# Patient Record
Sex: Male | Born: 1935 | Race: White | Hispanic: No | Marital: Married | Smoking: Former smoker
Health system: Southern US, Community
[De-identification: ages and names within clinical notes are randomized; demographics above are authoritative.]

## PROBLEM LIST (undated history)

## (undated) DIAGNOSIS — I10 Essential (primary) hypertension: Secondary | ICD-10-CM

## (undated) DIAGNOSIS — Z953 Presence of xenogenic heart valve: Secondary | ICD-10-CM

## (undated) DIAGNOSIS — L97909 Non-pressure chronic ulcer of unspecified part of unspecified lower leg with unspecified severity: Secondary | ICD-10-CM

## (undated) DIAGNOSIS — I739 Peripheral vascular disease, unspecified: Secondary | ICD-10-CM

## (undated) DIAGNOSIS — IMO0002 Reserved for concepts with insufficient information to code with codable children: Secondary | ICD-10-CM

## (undated) HISTORY — DX: Non-pressure chronic ulcer of unspecified part of unspecified lower leg with unspecified severity: L97.909

## (undated) HISTORY — PX: AORTIC VALVE REPLACEMENT: SHX41

## (undated) HISTORY — PX: APPENDECTOMY: SHX54

## (undated) HISTORY — PX: VERTEBRAL AUGMENTATION: SHX2660

## (undated) HISTORY — PX: CARDIAC CATHETERIZATION: SHX172

## (undated) HISTORY — DX: Presence of xenogenic heart valve: Z95.3

## (undated) HISTORY — DX: Peripheral vascular disease, unspecified: I73.9

## (undated) HISTORY — PX: CATARACT EXTRACTION: SUR2

## (undated) HISTORY — PX: HERNIA REPAIR: SHX51

## (undated) HISTORY — PX: PARTIAL COLECTOMY: SHX5273

## (undated) HISTORY — DX: Essential (primary) hypertension: I10

## (undated) HISTORY — DX: Reserved for concepts with insufficient information to code with codable children: IMO0002

---

## 2007-11-30 ENCOUNTER — Encounter: Payer: Self-pay | Admitting: Cardiology

## 2008-01-18 ENCOUNTER — Encounter: Payer: Self-pay | Admitting: Cardiology

## 2008-04-07 ENCOUNTER — Ambulatory Visit: Payer: Self-pay | Admitting: Cardiology

## 2008-04-14 ENCOUNTER — Ambulatory Visit: Payer: Self-pay | Admitting: Cardiology

## 2008-06-13 ENCOUNTER — Ambulatory Visit: Payer: Self-pay | Admitting: Cardiovascular Disease

## 2008-06-27 ENCOUNTER — Ambulatory Visit: Payer: Self-pay | Admitting: Cardiology

## 2008-06-29 ENCOUNTER — Encounter: Payer: Self-pay | Admitting: Cardiology

## 2008-11-22 ENCOUNTER — Encounter: Payer: Self-pay | Admitting: Cardiology

## 2009-02-24 ENCOUNTER — Encounter: Payer: Self-pay | Admitting: Cardiology

## 2009-06-28 ENCOUNTER — Ambulatory Visit: Payer: Self-pay | Admitting: Cardiology

## 2009-07-07 ENCOUNTER — Ambulatory Visit: Payer: Self-pay | Admitting: Cardiology

## 2009-08-25 DIAGNOSIS — I10 Essential (primary) hypertension: Secondary | ICD-10-CM | POA: Insufficient documentation

## 2009-08-25 DIAGNOSIS — I739 Peripheral vascular disease, unspecified: Secondary | ICD-10-CM

## 2009-08-28 ENCOUNTER — Encounter: Payer: Self-pay | Admitting: Cardiology

## 2009-09-15 ENCOUNTER — Encounter: Payer: Self-pay | Admitting: Cardiology

## 2009-11-23 ENCOUNTER — Encounter: Payer: Self-pay | Admitting: Cardiology

## 2009-12-05 ENCOUNTER — Encounter: Admission: RE | Admit: 2009-12-05 | Discharge: 2009-12-05 | Payer: Self-pay | Admitting: Neurosurgery

## 2009-12-29 ENCOUNTER — Encounter: Admission: RE | Admit: 2009-12-29 | Discharge: 2009-12-29 | Payer: Self-pay | Admitting: Neurosurgery

## 2010-01-08 ENCOUNTER — Ambulatory Visit: Payer: Self-pay | Admitting: Cardiology

## 2010-01-08 DIAGNOSIS — Z954 Presence of other heart-valve replacement: Secondary | ICD-10-CM | POA: Insufficient documentation

## 2010-01-08 DIAGNOSIS — Z9889 Other specified postprocedural states: Secondary | ICD-10-CM

## 2010-01-08 DIAGNOSIS — I209 Angina pectoris, unspecified: Secondary | ICD-10-CM

## 2010-01-19 ENCOUNTER — Encounter: Payer: Self-pay | Admitting: Cardiology

## 2010-03-07 ENCOUNTER — Encounter: Admission: RE | Admit: 2010-03-07 | Discharge: 2010-03-07 | Payer: Self-pay | Admitting: Neurosurgery

## 2010-06-05 ENCOUNTER — Encounter: Payer: Self-pay | Admitting: Cardiology

## 2010-06-06 ENCOUNTER — Encounter: Payer: Self-pay | Admitting: Physician Assistant

## 2010-06-06 ENCOUNTER — Encounter: Payer: Self-pay | Admitting: Cardiology

## 2010-06-07 ENCOUNTER — Ambulatory Visit: Payer: Self-pay | Admitting: Cardiology

## 2010-06-07 ENCOUNTER — Encounter: Payer: Self-pay | Admitting: Cardiology

## 2010-06-10 ENCOUNTER — Encounter: Payer: Self-pay | Admitting: Physician Assistant

## 2010-07-04 ENCOUNTER — Encounter: Admission: RE | Admit: 2010-07-04 | Discharge: 2010-07-04 | Payer: Self-pay | Admitting: Neurosurgery

## 2010-08-24 ENCOUNTER — Ambulatory Visit: Payer: Self-pay | Admitting: Cardiology

## 2010-09-06 ENCOUNTER — Encounter: Admission: RE | Admit: 2010-09-06 | Discharge: 2010-09-06 | Payer: Self-pay | Admitting: Neurosurgery

## 2010-09-25 ENCOUNTER — Encounter: Payer: Self-pay | Admitting: Cardiology

## 2010-09-29 ENCOUNTER — Encounter: Payer: Self-pay | Admitting: Cardiology

## 2010-10-30 ENCOUNTER — Encounter: Admission: RE | Admit: 2010-10-30 | Discharge: 2010-10-30 | Payer: Self-pay | Admitting: Neurosurgery

## 2010-12-08 ENCOUNTER — Encounter: Payer: Self-pay | Admitting: Cardiology

## 2011-01-03 NOTE — Consult Note (Signed)
Summary: CARDIOLOGY CONSULT/ [facility]  CARDIOLOGY CONSULT/ [facility]   Imported By: Zachary George 08/24/2010 10:30:21  _____________________________________________________________________  External Attachment:    Type:   Image     Comment:   External Document

## 2011-01-03 NOTE — Assessment & Plan Note (Signed)
Summary: 6 MO FU PER JAN REMINDER-SRS   Visit Type:  Follow-up Primary Provider:  Sherril Croon  CC:  follow-up visit.  History of Present Illness: the patient is an 77 year old male with a history of severe peripheral arterial disease as well as a nonhealing ulcer. The patient is status post aortic valve replacement and single-vessel coronary bypass grafting. The patient has resolution of his left leg ulcer. He is less lower extremity edema and on his last office visit. He has severe peripheral vascular disease. He has been evaluated by Dr. Excell Seltzer who felt that he had more venous stasis and arterial inflow disease. Recent lower extremity duplex Doppler done by [facility] revealed absence of flow in the bilateral anterior tibial and posterior tibial arteries. ABIs could not be performed.  The patient is essentially wheelchair bound. He does walk some in the house but it shot up quickly short of breath on exertion and also reports chest tightness. His symptoms are consistent with angina. He has not used any sublingual nitroglycerin. He also has no long-acting nitroglycerin as part of his medical regimen. The patient is hypertensive in the office today but according to his daughter his blood pressures controlled at home. The patient denies any orthopnea PND palpitations or syncope. He reports no resting claudication.  Preventive Screening-Counseling & Management  Alcohol-Tobacco     Smoking Status: quit     Year Quit: 1960  Current Medications (verified): 1)  Metoprolol Tartrate 50 Mg Tabs (Metoprolol Tartrate) .... Take 1 Tablet By Mouth Once A Day 2)  Zinc/copper 2mg  .... Take 1 Tablet By Mouth Once A Day 3)  Hydroxyzine Hcl 25 Mg Tabs (Hydroxyzine Hcl) .... Take 1/2 Tablet By Mouth Three Times A Day As Needed 4)  Citalopram Hydrobromide 20 Mg Tabs (Citalopram Hydrobromide) .... Take 1 Tablet By Mouth Once A Day 5)  Amitriptyline Hcl 10 Mg Tabs (Amitriptyline Hcl) .... Take 1 Tablet By  Mouth Once A Day 6)  Calcium-Magnesium-Zinc 333-133-8.3 Mg Tabs (Calcium-Magnesium-Zinc) .... Take 1 Tablet By Mouth Once A Day 7)  Folic Acid 1 Mg Tabs (Folic Acid) .... Take 1 Tablet By Mouth Once A Day 8)  Flax Seed Oil 1000 Mg Caps (Flaxseed (Linseed)) .... Take 1 Tablet By Mouth Once A Day 9)  Ascorbic Acid 500 Mg Tabs (Ascorbic Acid) .... Take 1 Tablet By Mouth Twice A Day 10)  Coenzyme Q10 25 Mg Tabs (Coenzyme Q10) .... Take 1 Tablet By Mouth Once A Day 11)  Vitamin E 400 Unit Caps (Vitamin E) .... Take 1 Tablet By Mouth Once A Day 12)  Diphenhydramine Hcl 25 Mg Tabs (Diphenhydramine Hcl) .... Take 1 Tablet By Mouth Once A Day 13)  Zyrtec Allergy 10 Mg Tabs (Cetirizine Hcl) .... Take 1 Tablet By Mouth Once A Day 14)  Hydrocodone-Acetaminophen 7.5-500 Mg Tabs (Hydrocodone-Acetaminophen) .... As Needed 15)  Maxalt-Mlt 5 Mg Tbdp (Rizatriptan Benzoate) .... As Needed 16)  Furosemide 20 Mg Tabs (Furosemide) .... Take 1 Tablet By Mouth Once A Day As Needed 17)  Finasteride 5 Mg Tabs (Finasteride) .... Take 1 Tablet By Mouth Once A Day 18)  Aricept 5 Mg Tabs (Donepezil Hcl) .... Take 1 Tablet By Mouth Once A Day 19)  Excedrin Migraine 250-250-65 Mg Tabs (Aspirin-Acetaminophen-Caffeine) .... As Needed 20)  Tylenol Pm Extra Strength 500-25 Mg Tabs (Diphenhydramine-Apap (Sleep)) .... As Needed 21)  Systane 0.4-0.3 % Soln (Polyethyl Glycol-Propyl Glycol) .... As Needed 22)  B Complex 100  Tabs (B Complex Vitamins) .... Take 1  Tablet By Mouth Once A Day 23)  Fish Oil 1200 Mg Caps (Omega-3 Fatty Acids) .... Take 1 Tablet By Mouth Once A Day 24)  Sam-E Complete 400 Mg Tbec (S-Adenosylmethionine) .... Take 1 Tablet By Mouth Once A Day 25)  Coq-10 100 Mg Caps (Coenzyme Q10) .... Take 1 Tablet By Mouth Once A Day 26)  B-Caro-T 15 Mg Caps (Beta Carotene) .... Take 1 Tablet By Mouth Once A Day  Allergies (verified): 1)  ! * Ivp Dye  Comments:  Nurse/Medical Assistant: The patient's medications and  allergies were reviewed with the patient and were updated in the Medication and Allergy Lists. List reviewed.  Past History:  Past Medical History: HYPERTENSION, UNSPECIFIED (ICD-401.9) PVD (ICD-443.9) Degenerative disk disease History of right lower exremity ulcer, resolved Status post aortic valve replacement. Bioprosthesis. Mild outflow gradient LVOT these 3.5 m/s  Social History: Smoking Status:  quit  Review of Systems       The patient complains of fatigue, decreased hearing, chest pain, shortness of breath, and leg swelling.  The patient denies malaise, fever, weight gain/loss, vision loss, hoarseness, palpitations, prolonged cough, wheezing, sleep apnea, coughing up blood, abdominal pain, blood in stool, nausea, vomiting, diarrhea, heartburn, incontinence, blood in urine, muscle weakness, joint pain, rash, skin lesions, headache, fainting, dizziness, depression, anxiety, enlarged lymph nodes, easy bruising or bleeding, and environmental allergies.    Vital Signs:  Patient profile:   77 year old male Height:      68 inches Weight:      208 pounds Pulse rate:   58 / minute BP sitting:   161 / 71  (left arm) Cuff size:   large  Vitals Entered By: Carlye Grippe (January 08, 2010 10:42 AM) CC: follow-up visit   Physical Exam  Additional Exam:  General: Well-developed, well-nourished in no distress head: Normocephalic and atraumatic eyes PERRLA/EOMI intact, conjunctiva and lids normal nose: No deformity or lesions mouth normal dentition, normal posterior pharynx neck: Supple, no JVD.  No masses, thyromegaly or abnormal cervical nodes. bilateral carotid bruits. lungs: Normal breath sounds bilaterally without wheezing.  Normal percussion heart: regular rate and rhythm with normal S1 and S2, no S3 or S4.  PMI is normal.  2/6 crescendo decrescendo murmur. abdomen: Normal bowel sounds, abdomen is soft and nontender without masses, organomegaly or hernias noted.  No  hepatosplenomegaly musculoskeletal: Back normal, normal gait muscle strength and tone normal pulsus: absent pulses in dorsalis pedis and posterior tibial pulses. Bilaterally. Extremities:workup was performed pitting edema. Varicose veins no definite ulcers. neurologic: Alert and oriented x 3 skin: Intact without lesions or rashes cervical nodes: No significant adenopathy psychologic: Normal affect    Impression & Recommendations:  Problem # 1:  CAROTID ENDARTERECTOMY, HX OF (ICD-V15.1) the patient is status post left carotid endarterectomy. He has bilateral carotid bruits. He is scheduled for carotid Dopplers.  Problem # 2:  AORTIC VALVE REPLACEMENT, HX OF (ICD-V43.3) apparently normal function of the aortic bioprosthesis. Recent echocardiogram shows normal valve function.  Problem # 3:  HYPERTROPHIC CARDIOMYOPATHY (ICD-425.1) the patient has an outflow gradient with a velocity of 3.5 m/s. I suspect is contributing to his shortness of breath on exertion. However we will continue with medical therapy and the patient is adequately beta blocked with good control of heart rate. His updated medication list for this problem includes:    Metoprolol Tartrate 50 Mg Tabs (Metoprolol tartrate) .Marland Kitchen... Take 1 tablet by mouth once a day    Furosemide 20 Mg Tabs (Furosemide) .Marland KitchenMarland KitchenMarland KitchenMarland Kitchen  Take 1 tablet by mouth once a day as needed    Isosorbide Mononitrate Cr 30 Mg Xr24h-tab (Isosorbide mononitrate) .Marland Kitchen... Take one tablet by mouth daily    Nitrostat 0.4 Mg Subl (Nitroglycerin) .Marland Kitchen... 1 tablet under tongue at onset of chest pain; you may repeat every 5 minutes for up to 3 doses.  Problem # 4:  HYPERTENSION, UNSPECIFIED (ICD-401.9) hypertension is poorly controlled in the office but reportedly at home the patient's normal blood pressure readings. I made no changes in his antihypertensive regimen and he can follow up with his primary care physician. His updated medication list for this problem includes:     Metoprolol Tartrate 50 Mg Tabs (Metoprolol tartrate) .Marland Kitchen... Take 1 tablet by mouth once a day    Furosemide 20 Mg Tabs (Furosemide) .Marland Kitchen... Take 1 tablet by mouth once a day as needed  Problem # 5:  ANGINA, STABLE (ICD-413.9) the patient does have stable exertional angina. I added isosorbide mononitrate was medical regimen as well provided him with p.r.n. nitroglycerin. His updated medication list for this problem includes:    Metoprolol Tartrate 50 Mg Tabs (Metoprolol tartrate) .Marland Kitchen... Take 1 tablet by mouth once a day    Isosorbide Mononitrate Cr 30 Mg Xr24h-tab (Isosorbide mononitrate) .Marland Kitchen... Take one tablet by mouth daily    Nitrostat 0.4 Mg Subl (Nitroglycerin) .Marland Kitchen... 1 tablet under tongue at onset of chest pain; you may repeat every 5 minutes for up to 3 doses.  Other Orders: Carotid Duplex (Carotid Duplex)  Patient Instructions: 1)  Your physician has requested that you have a carotid duplex. This test is an ultrasound of the carotid arteries in your neck. It looks at blood flow through these arteries that supply the brain with blood. Allow one hour for this exam. There are no restrictions or special instructions. 2)  Start Imdur (isosorbide) 30mg  by mouth once daily. 3)  Your physician recommended you take 1 tablet (or 1 spray) under tongue at onset of chest pain; you may repeat every 5 minutes for up to 3 doses. If 3 or more doses are required, call 911 and proceed to the ER immediately. 4)  Your physician wants you to follow-up in: 6 months. You will receive a reminder letter in the mail one-two months in advance. If you don't receive a letter, please call our office to schedule the follow-up appointment. Prescriptions: NITROSTAT 0.4 MG SUBL (NITROGLYCERIN) 1 tablet under tongue at onset of chest pain; you may repeat every 5 minutes for up to 3 doses.  #25 x 3   Entered by:   Cyril Loosen, RN, BSN   Authorized by:   Lewayne Bunting, MD, [facility]   Signed by:   Cyril Loosen, RN, BSN on  01/08/2010   Method used:   Electronically to        [facility] (retail)       [address]       [place], [place]  [address]       Ph: [phone]       Fax: [phone]   RxID:   [id number]   Handout requested. ISOSORBIDE MONONITRATE CR 30 MG XR24H-TAB (ISOSORBIDE MONONITRATE) Take one tablet by mouth daily  #30 x 6   Entered by:   Cyril Loosen, RN, BSN   Authorized by:   Lewayne Bunting, MD, [facility]   Signed by:   Cyril Loosen, RN, BSN on 01/08/2010   Method used:   Electronically to        [facility] (  retail)       [address]       [place], [place]  [address]       Ph: [phone]       Fax: [phone]   RxID:   [id number]   Handout requested.

## 2011-01-03 NOTE — Assessment & Plan Note (Signed)
Summary: 6 MO FU PER AUG REMINDER-SRS   Visit Type:  Follow-up Primary Provider:  Sherril Croon   History of Present Illness: the patient is an 77 year old male with a history of severe peripheral vascular disease and a prior nonhealing ulcer. He is status post aortic valve replacement and single-vessel coronary bypass grafting. The patient has both venous stasis and arterial inflow disease. Currently however he has no ulcers.a recent echocardiogram the patient was also found to have hypertrophic obstructive cardiomyopathy with outflow velocity of 5.4 m/s. The patient was placed on beta blocker which was further increased. He also has history of stroke. He status post coronary bypass grafting in 1999. He had an aortic valve replacement in 1996.  He states he gets fatigued. He denies any chest pressure and some breath at rest. He does have shortness of breath on minimal exertion. He denies any presyncope or syncope.    Preventive Screening-Counseling & Management  Alcohol-Tobacco     Smoking Status: quit     Year Quit: 1960  Current Medications (verified): 1)  Metoprolol Tartrate 50 Mg Tabs (Metoprolol Tartrate) .... Take 1 Tablet By Mouth Two Times A Day 2)  Zinc/copper 2mg  .... Take 1 Tablet By Mouth Once A Day 3)  Citalopram Hydrobromide 20 Mg Tabs (Citalopram Hydrobromide) .... Take 1 Tablet By Mouth Once A Day 4)  Calcium-Magnesium-Zinc 333-133-8.3 Mg Tabs (Calcium-Magnesium-Zinc) .... Take 1 Tablet By Mouth Once A Day 5)  Folic Acid 1 Mg Tabs (Folic Acid) .... Take 1 Tablet By Mouth Once A Day 6)  Flax Seed Oil 1000 Mg Caps (Flaxseed (Linseed)) .... Take 1 Tablet By Mouth Once A Day 7)  Ascorbic Acid 500 Mg Tabs (Ascorbic Acid) .... Take 1 Tablet By Mouth Twice A Day 8)  Vitamin E 400 Unit Caps (Vitamin E) .... Take 1 Tablet By Mouth Once A Day 9)  Diphenhydramine Hcl 25 Mg Tabs (Diphenhydramine Hcl) .... Take 1 Tablet By Mouth Once A Day 10)  Zyrtec Allergy 10 Mg Tabs (Cetirizine Hcl) ....  Take 1 Tablet By Mouth Once A Day 11)  Hydrocodone-Acetaminophen 7.5-500 Mg Tabs (Hydrocodone-Acetaminophen) .... As Needed 12)  Maxalt-Mlt 5 Mg Tbdp (Rizatriptan Benzoate) .... As Needed 13)  Furosemide 20 Mg Tabs (Furosemide) .... Take 1 Tablet By Mouth Once A Day 14)  Finasteride 5 Mg Tabs (Finasteride) .... Take 1 Tablet By Mouth Once A Day 15)  Aricept 5 Mg Tabs (Donepezil Hcl) .... Take 1 Tablet By Mouth Once A Day 16)  Excedrin Migraine 250-250-65 Mg Tabs (Aspirin-Acetaminophen-Caffeine) .... As Needed 17)  Tylenol Pm Extra Strength 500-25 Mg Tabs (Diphenhydramine-Apap (Sleep)) .... As Needed 18)  Systane 0.4-0.3 % Soln (Polyethyl Glycol-Propyl Glycol) .... As Needed 19)  B Complex 100  Tabs (B Complex Vitamins) .... Take 1 Tablet By Mouth Once A Day 20)  Fish Oil 1200 Mg Caps (Omega-3 Fatty Acids) .... Take 1 Tablet By Mouth Once A Day 21)  Sam-E Complete 400 Mg Tbec (S-Adenosylmethionine) .... Take 1 Tablet By Mouth Two Times A Day 22)  Coq-10 100 Mg Caps (Coenzyme Q10) .... Take 1 Tablet By Mouth Once A Day 23)  B-Caro-T 15 Mg Caps (Beta Carotene) .... Take 1 Tablet By Mouth Once A Day 24)  Nitrostat 0.4 Mg Subl (Nitroglycerin) .Marland Kitchen.. 1 Tablet Under Tongue At Onset of Chest Pain; You May Repeat Every 5 Minutes For Up To 3 Doses. 25)  Meclizine Hcl 12.5 Mg Tabs (Meclizine Hcl) .... Take 2 Tablet By Mouth Once A Day 26)  Klor-Con 10 10 Meq Cr-Tabs (Potassium Chloride) .... Take 1 Tablet By Mouth Once A Day 27)  Diphenhydramine Hcl 25 Mg Tabs (Diphenhydramine Hcl) .... Take 2 Tablet By Mouth Once A Day  Allergies (verified): 1)  ! * Ivp Dye  Comments:  Nurse/Medical Assistant: The patient's medication list and allergies were reviewed with the patient and were updated in the Medication and Allergy Lists.  Past History:  Past Medical History: Last updated: 01/08/2010 HYPERTENSION, UNSPECIFIED (ICD-401.9) PVD (ICD-443.9) Degenerative disk disease History of right lower exremity  ulcer, resolved Status post aortic valve replacement. Bioprosthesis. Mild outflow gradient LVOT these 3.5 m/s  Past Surgical History: Last updated: 08/25/2009 Hernia surgery Partial colectomy Vertebral augmentation Appendectomy Carotid Endarterectomy Cataract Extraction Valve Replacement-Aortic (S/P)  Family History: Last updated: 08/25/2009 Family History of Cancer:  Family History of Coronary Artery Disease:  Family History of Diabetes:   Social History: Last updated: 08/25/2009 Retired  Married  Tobacco Use - No.  Alcohol Use - no Regular Exercise - no Drug Use - no  Risk Factors: Smoking Status: quit (08/24/2010)  Review of Systems       The patient complains of fatigue, decreased hearing, and shortness of breath.  The patient denies malaise, fever, weight gain/loss, vision loss, hoarseness, chest pain, palpitations, prolonged cough, wheezing, sleep apnea, coughing up blood, abdominal pain, blood in stool, nausea, vomiting, diarrhea, heartburn, incontinence, blood in urine, muscle weakness, joint pain, leg swelling, rash, skin lesions, headache, fainting, dizziness, depression, anxiety, enlarged lymph nodes, easy bruising or bleeding, and environmental allergies.    Vital Signs:  Patient profile:   77 year old male Height:      68 inches Weight:      217 pounds BMI:     33.11 O2 Sat:      94 % on Room air Pulse rate:   84 / minute BP sitting:   167 / 78  (left arm) Cuff size:   large  Vitals Entered By: Carlye Grippe (August 24, 2010 1:56 PM)  O2 Flow:  Room air  Physical Exam  Additional Exam:  General: Well-developed, well-nourished in no distress head: Normocephalic and atraumatic eyes PERRLA/EOMI intact, conjunctiva and lids normal nose: No deformity or lesions mouth normal dentition, normal posterior pharynx neck: Supple, no JVD.  No masses, thyromegaly or abnormal cervical nodes. bilateral carotid bruits. lungs: Normal breath sounds bilaterally  without wheezing.  Normal percussion heart: regular rate and rhythm with normal S1 and S2, no S3 or S4.  PMI is normal.  2/6 crescendo decrescendo murmur. abdomen: Normal bowel sounds, abdomen is soft and nontender without masses, organomegaly or hernias noted.  No hepatosplenomegaly musculoskeletal: Back normal, normal gait muscle strength and tone normal pulsus: absent pulses in dorsalis pedis and posterior tibial pulses. Bilaterally. Extremities:workup was performed pitting edema. Varicose veins no definite ulcers. neurologic: Alert and oriented x 3 skin: Intact without lesions or rashes cervical nodes: No significant adenopathy psychologic: Normal affect    Impression & Recommendations:  Problem # 1:  HYPERTROPHIC CARDIOMYOPATHY (ICD-425.1) significant outflow gradient with systolic anterior motion of the mitral valve. Metoprolol be further increased to 50 mg twice a day. The patient is not a surgical candidate however. The following medications were removed from the medication list:    Isosorbide Mononitrate Cr 30 Mg Xr24h-tab (Isosorbide mononitrate) .Marland Kitchen... Take one tablet by mouth daily His updated medication list for this problem includes:    Metoprolol Tartrate 50 Mg Tabs (Metoprolol tartrate) .Marland Kitchen... Take 1 tablet by mouth two  times a day    Furosemide 20 Mg Tabs (Furosemide) .Marland Kitchen... Take 1 tablet by mouth once a day    Nitrostat 0.4 Mg Subl (Nitroglycerin) .Marland Kitchen... 1 tablet under tongue at onset of chest pain; you may repeat every 5 minutes for up to 3 doses.  Problem # 2:  AORTIC VALVE REPLACEMENT, HX OF (ICD-V43.3) normal heart valve function by recent echocardiogram  Problem # 3:  HYPERTENSION, UNSPECIFIED (ICD-401.9) blood pressure was poorly controlled and will further increase his Toprol His updated medication list for this problem includes:    Metoprolol Tartrate 50 Mg Tabs (Metoprolol tartrate) .Marland Kitchen... Take 1 tablet by mouth two times a day    Furosemide 20 Mg Tabs  (Furosemide) .Marland Kitchen... Take 1 tablet by mouth once a day  Problem # 4:  PVD (ICD-443.9) Assessment: Comment Only  Patient Instructions: 1)  Increase Metoprolol to 50mg  two times a day 2)  Follow up in  6 months Prescriptions: METOPROLOL TARTRATE 50 MG TABS (METOPROLOL TARTRATE) Take 1 tablet by mouth two times a day  #60 x 6   Entered by:   Hoover Brunette, LPN   Authorized by:   Lewayne Bunting, MD, [facility]   Signed by:   Hoover Brunette, LPN on 11/91/4782   Method used:   Electronically to        [facility] (retail)       [address]       [place], [place]  [address]       Ph: [phone]       Fax: [phone]   RxID:   [id number]

## 2011-01-03 NOTE — Medication Information (Signed)
Summary: RX Folder/ MEDICATION LIST  RX Folder/ MEDICATION LIST   Imported By: Dorise Hiss 01/12/2010 10:48:02  _____________________________________________________________________  External Attachment:    Type:   Image     Comment:   External Document

## 2011-01-03 NOTE — Letter (Signed)
Summary: [facility] D/C DR. DHRUV VYAS  [facility]C DR. DHRUV VYAS   Imported By: Zachary George 08/24/2010 10:30:56  _____________________________________________________________________  External Attachment:    Type:   Image     Comment:   External Document

## 2011-01-03 NOTE — Progress Notes (Signed)
Summary: Office Visit/ SURGERY/HOSPITALIZATION LIST  Office Visit/ SURGERY/HOSPITALIZATION LIST   Imported By: Dorise Hiss 01/12/2010 10:51:34  _____________________________________________________________________  External Attachment:    Type:   Image     Comment:   External Document

## 2011-01-03 NOTE — Consult Note (Signed)
Summary: PULMONARY CONSULT/ DR. HENDERSON  PULMONARY CONSULT/ DR. HENDERSON   Imported By: Zachary George 08/24/2010 10:31:20  _____________________________________________________________________  External Attachment:    Type:   Image     Comment:   External Document

## 2011-01-21 ENCOUNTER — Other Ambulatory Visit: Payer: Self-pay | Admitting: Neurosurgery

## 2011-01-21 DIAGNOSIS — M47816 Spondylosis without myelopathy or radiculopathy, lumbar region: Secondary | ICD-10-CM

## 2011-01-23 ENCOUNTER — Ambulatory Visit
Admission: RE | Admit: 2011-01-23 | Discharge: 2011-01-23 | Disposition: A | Discharge status: Home / Self Care | Payer: Self-pay | Source: Ambulatory Visit | Attending: Neurosurgery | Admitting: Neurosurgery

## 2011-01-23 DIAGNOSIS — M47816 Spondylosis without myelopathy or radiculopathy, lumbar region: Secondary | ICD-10-CM

## 2011-01-24 ENCOUNTER — Encounter: Payer: Self-pay | Admitting: Cardiology

## 2011-01-24 ENCOUNTER — Ambulatory Visit (INDEPENDENT_AMBULATORY_CARE_PROVIDER_SITE_OTHER): Payer: Medicare Other | Admitting: Cardiology

## 2011-01-24 DIAGNOSIS — J449 Chronic obstructive pulmonary disease, unspecified: Secondary | ICD-10-CM | POA: Insufficient documentation

## 2011-01-24 DIAGNOSIS — R0602 Shortness of breath: Secondary | ICD-10-CM

## 2011-01-24 DIAGNOSIS — J4489 Other specified chronic obstructive pulmonary disease: Secondary | ICD-10-CM | POA: Insufficient documentation

## 2011-01-24 DIAGNOSIS — I421 Obstructive hypertrophic cardiomyopathy: Secondary | ICD-10-CM

## 2011-01-29 NOTE — Letter (Signed)
Summary: [facility] D/C DR. VYAS  [facility] D[facility]C DR. VYAS   Imported By: Zachary George 01/24/2011 10:35:56  _____________________________________________________________________  External Attachment:    Type:   Image     Comment:   External Document

## 2011-01-29 NOTE — Medication Information (Signed)
Summary: [facility] D/C MEDICATION SHEET ORDER  [facility] D/C MEDICATION SHEET ORDER   Imported By: Zachary George 01/24/2011 10:53:07  _____________________________________________________________________  External Attachment:    Type:   Image     Comment:   External Document

## 2011-01-29 NOTE — Assessment & Plan Note (Signed)
Summary: 6 month followup/rm   Visit Type:  Follow-up Primary Provider:  Sherril Croon   History of Present Illness: The patient is an 77 year old male with multiple medical problems.  He was admitted to year ago with a lower GI bleed.  EGD was negative.  His history of diastolic heart failure.  Has a history of hypertension the patient was seen in the emergency room a month ago with cough and congestion.  Sore throat.  He complained of muscle aches.  He was given amoxicillin for bronchitis. The patient had an echocardiogram done January 2011 areas he has severe asymmetric LVH and systolic anterior motion of the mitral valve consistent with hypertrophic obstructive cardio myopathy.  Left ventricle outflow gradient at rest is 5.4 m/sec there L. intraseptal aneurysm.  The estimated ejection fraction was read as 65%.  Bio prosthetic aortic valve was present.  There is mild/moderate regurgitation. The patient was seen in consultation at that time by Dr. Jens Som.  Again this was in January of 2011.  He presented with heart failure.  This was diastolic heart failure.  His Toprol was started increased to 75 mg a day.  Lasix was discontinued at that time. Of note is that the patient also has history of coronary artery disease and is status post bypass grafting 1999 as well status-post by prostatic aortic valve replacement. The patient has chronic COPD and is oxygen dependent.  He uses oxygen only at night the patient stated he feels well today.  He does have shortness of breath on minimal exertion.  He noticed that this has worsened over the last couple months.  He also has increased lower extremity edema and his primary care physician increased Lasix for two days.  Unfortunately after his last hospitalization his metoprolol was never increased to ameliorate his symptoms of hypertrophic cardiomyopathy.   Preventive Screening-Counseling & Management  Alcohol-Tobacco     Smoking Status: quit     Year Quit:  1960  Current Medications (verified): 1)  Metoprolol Tartrate 50 Mg Tabs (Metoprolol Tartrate) .... Take 1 Tablet By Mouth Once A Day 2)  Zinc/copper 2mg  .... Take 1 Tablet By Mouth Once A Day 3)  Citalopram Hydrobromide 20 Mg Tabs (Citalopram Hydrobromide) .... Take 1 Tablet By Mouth Once A Day 4)  Calcium-Magnesium-Zinc 333-133-8.3 Mg Tabs (Calcium-Magnesium-Zinc) .... Take 1 Tablet By Mouth Once A Day 5)  Folic Acid 1 Mg Tabs (Folic Acid) .... Take 1 Tablet By Mouth Once A Day 6)  Flax Seed Oil 1000 Mg Caps (Flaxseed (Linseed)) .... Take 1 Tablet By Mouth Once A Day 7)  Ascorbic Acid 500 Mg Tabs (Ascorbic Acid) .... Take 1 Tablet By Mouth Twice A Day 8)  Vitamin E 400 Unit Caps (Vitamin E) .... Take 1 Tablet By Mouth Once A Day 9)  Diphenhydramine Hcl 25 Mg Tabs (Diphenhydramine Hcl) .... Take 1 Tablet By Mouth Once A Day 10)  Zyrtec Allergy 10 Mg Tabs (Cetirizine Hcl) .... Take 1 Tablet By Mouth Once A Day 11)  Maxalt-Mlt 5 Mg Tbdp (Rizatriptan Benzoate) .... As Needed 12)  Furosemide 40 Mg Tabs (Furosemide) .... Take 1 Tablet By Mouth Once A Day 13)  Finasteride 5 Mg Tabs (Finasteride) .... Take 1 Tablet By Mouth Once A Day 14)  Excedrin Migraine 250-250-65 Mg Tabs (Aspirin-Acetaminophen-Caffeine) .... As Needed 15)  Tylenol Pm Extra Strength 500-25 Mg Tabs (Diphenhydramine-Apap (Sleep)) .... As Needed 16)  Systane 0.4-0.3 % Soln (Polyethyl Glycol-Propyl Glycol) .... As Needed 17)  B Complex 100  Tabs (  B Complex Vitamins) .... Take 1 Tablet By Mouth Once A Day 18)  Fish Oil 1200 Mg Caps (Omega-3 Fatty Acids) .... Take 1 Tablet By Mouth Once A Day 19)  Sam-E Complete 400 Mg Tbec (S-Adenosylmethionine) .... Take 1 Tablet By Mouth Two Times A Day 20)  Coq-10 100 Mg Caps (Coenzyme Q10) .... Take 1 Tablet By Mouth Once A Day 21)  B-Caro-T 15 Mg Caps (Beta Carotene) .... Take 1 Tablet By Mouth Once A Day 22)  Nitrostat 0.4 Mg Subl (Nitroglycerin) .Marland Kitchen.. 1 Tablet Under Tongue At Onset of Chest  Pain; You May Repeat Every 5 Minutes For Up To 3 Doses. 23)  Meclizine Hcl 12.5 Mg Tabs (Meclizine Hcl) .... Take 2 Tablet By Mouth Once A Day 24)  Klor-Con 10 10 Meq Cr-Tabs (Potassium Chloride) .... Take 1 Tablet By Mouth Once A Day 25)  Diphenhydramine Hcl 25 Mg Tabs (Diphenhydramine Hcl) .... Take 2 Tablet By Mouth Once A Day 26)  Nystatin 100000 Unit/ml Susp (Nystatin) .Marland Kitchen.. 1 Tsp Three Times A Day For Thrush 27)  Vitamin D3 400 Unit Tabs (Cholecalciferol) .... Take 1 Tablet By Mouth Once A Day 28)  Meclizine Hcl 12.5 Mg Tabs (Meclizine Hcl) .... Take 1 Tablet By Mouth Two Times A Day As Needed 29)  Pantoprazole Sodium 40 Mg Tbec (Pantoprazole Sodium) .... Take 1 Tablet By Mouth Once A Day 30)  Stool Softener 100 Mg Caps (Docusate Sodium) .... Take 1 Tablet By Mouth Once A Day 31)  Klor-Con 10 10 Meq Cr-Tabs (Potassium Chloride) .... Take 1 Tablet By Mouth Once A Day 32)  Tramadol Hcl 50 Mg Tabs (Tramadol Hcl) .... Take 1 Tablet By Mouth Two Times A Day As Needed 33)  Eq Allergy 25 Mg Caps (Diphenhydramine Hcl) .... Take 1-2 Tablet By Mouth Once A Day in Morning For Allergies  Allergies: 1)  ! * Ivp Dye 2)  ! Lortab  Comments:  Nurse/Medical Assistant: The patient's medication list and allergies were reviewed with the patient and were updated in the Medication and Allergy Lists.  Past History:  Past Medical History: Last updated: 01/08/2010 HYPERTENSION, UNSPECIFIED (ICD-401.9) PVD (ICD-443.9) Degenerative disk disease History of right lower exremity ulcer, resolved Status post aortic valve replacement. Bioprosthesis. Mild outflow gradient LVOT these 3.5 m/s  Past Surgical History: Last updated: 08/25/2009 Hernia surgery Partial colectomy Vertebral augmentation Appendectomy Carotid Endarterectomy Cataract Extraction Valve Replacement-Aortic (S/P)  Family History: Last updated: 08/25/2009 Family History of Cancer:  Family History of Coronary Artery Disease:  Family  History of Diabetes:   Social History: Last updated: 08/25/2009 Retired  Married  Tobacco Use - No.  Alcohol Use - no Regular Exercise - no Drug Use - no  Risk Factors: Exercise: no (08/25/2009)  Risk Factors: Smoking Status: quit (01/24/2011)  Review of Systems       The patient complains of fatigue, shortness of breath, and muscle weakness.  The patient denies malaise, fever, weight gain/loss, vision loss, decreased hearing, hoarseness, chest pain, palpitations, prolonged cough, wheezing, sleep apnea, coughing up blood, abdominal pain, blood in stool, nausea, vomiting, diarrhea, heartburn, incontinence, blood in urine, joint pain, leg swelling, rash, skin lesions, headache, fainting, dizziness, depression, anxiety, enlarged lymph nodes, easy bruising or bleeding, and environmental allergies.    Vital Signs:  Patient profile:   77 year old male Height:      68 inches Weight:      223 pounds Pulse rate:   69 / minute BP sitting:   166 /  72  (left arm) Cuff size:   large  Vitals Entered By: Carlye Grippe (January 24, 2011 11:20 AM)  Physical Exam  Additional Exam:  General: Well-developed, well-nourished in no distress head: Normocephalic and atraumatic eyes PERRLA/EOMI intact, conjunctiva and lids normal nose: No deformity or lesions mouth normal dentition, normal posterior pharynx neck: Supple, no JVD.  No masses, thyromegaly or abnormal cervical nodes. bilateral carotid bruits. lungs: Normal breath sounds bilaterally without wheezing.  Normal percussion heart: regular rate and rhythm with normal S1 and S2, no S3 or S4.  PMI is normal.  4/6 crescendo decrescendo murmur right upper sternal border increased with Valsalva abdomen: Normal bowel sounds, abdomen is soft and nontender without masses, organomegaly or hernias noted.  No hepatosplenomegaly musculoskeletal: Back normal, normal gait muscle strength and tone normal pulsus: absent pulses in dorsalis pedis and  posterior tibial pulses. Bilaterally. Extremities:3+ lower extremity pitting edema. Varicose veins no definite ulcers. neurologic: Alert and oriented x 3 skin: Intact without lesions or rashes cervical nodes: No significant adenopathy psychologic: Normal affect    Impression & Recommendations:  Problem # 1:  HYPERTROPHIC CARDIOMYOPATHY (ICD-425.1) hypertrophic obstructive cardiomyopathy LVOT gradient 5.4 m/sec at rest: Repeat echocardiogram, increase the Toprol to 50 was p.o. b.i.d.  Pending results of echocardiogram questionable the patient should be considered for alcohol septal ablation.  He does appear to have symptomatic hypertrophic obstructive cardio myopathy as a quite a prominent murmur and exam which worsens with Valsalva His updated medication list for this problem includes:    Metoprolol Tartrate 50 Mg Tabs (Metoprolol tartrate) .Marland Kitchen... Take 1 tablet by mouth two times a day    Furosemide 40 Mg Tabs (Furosemide) .Marland Kitchen... Take 1 tablet by mouth once a day    Nitrostat 0.4 Mg Subl (Nitroglycerin) .Marland Kitchen... 1 tablet under tongue at onset of chest pain; you may repeat every 5 minutes for up to 3 doses.  Orders: 2-D Echocardiogram (2D Echo)  Problem # 2:  AORTIC VALVE REPLACEMENT, HX OF (ICD-V43.3) Assessment: Comment Only  Orders: 2-D Echocardiogram (2D Echo)  Problem # 3:  ANGINA, STABLE (ICD-413.9) ischemic cardiomyopathy ejection fraction 65% status post coronary bypass grafting His updated medication list for this problem includes:    Metoprolol Tartrate 50 Mg Tabs (Metoprolol tartrate) .Marland Kitchen... Take 1 tablet by mouth two times a day    Nitrostat 0.4 Mg Subl (Nitroglycerin) .Marland Kitchen... 1 tablet under tongue at onset of chest pain; you may repeat every 5 minutes for up to 3 doses.  Problem # 4:  HYPERTENSION, UNSPECIFIED (ICD-401.9) Assessment: Comment Only  His updated medication list for this problem includes:    Metoprolol Tartrate 50 Mg Tabs (Metoprolol tartrate) .Marland Kitchen... Take 1 tablet  by mouth two times a day    Furosemide 40 Mg Tabs (Furosemide) .Marland Kitchen... Take 1 tablet by mouth once a day  Problem # 5:  COPD (ICD-496)  Other Orders: T-Basic Metabolic Panel [id number]) T-BNP  (B Natriuretic Peptide) [phone])  Patient Instructions: 1)  Follow up with Dr. Earnestine Leys on  [facility], March 25, 2011 AT 10:45AM. 2)  Increase Metoprolol to 50mg  by mouth two times a day. 3)  Your physician has requested that you have an echocardiogram.  Echocardiography is a painless test that uses sound waves to create images of your heart. It provides your doctor with information about the size and shape of your heart and how well your heart's chambers and valves are working.  This procedure takes approximately one hour. There are no restrictions for this procedure.  4)  Your physician recommends that you have lab work completed. (BMET/BNP) Prescriptions: METOPROLOL TARTRATE 50 MG TABS (METOPROLOL TARTRATE) Take 1 tablet by mouth two times a day  #60 x 6   Entered by:   Cyril Loosen, RN, BSN   Authorized by:   Lewayne Bunting, MD, [facility]   Signed by:   Cyril Loosen, RN, BSN on 01/24/2011   Method used:   Electronically to        [facility]* (retail)       [address]       [place], [place]  [address]       Ph: [phone]       Fax: [phone]   RxID:   [id number]

## 2011-01-29 NOTE — Consult Note (Signed)
Summary: [facility] DR. Arlean Hopping  [facility] DR. Arlean Hopping   Imported By: Zachary George 01/24/2011 10:30:08  _____________________________________________________________________  External Attachment:    Type:   Image     Comment:   External Document

## 2011-02-04 ENCOUNTER — Encounter: Payer: Self-pay | Admitting: Cardiology

## 2011-02-04 DIAGNOSIS — I359 Nonrheumatic aortic valve disorder, unspecified: Secondary | ICD-10-CM

## 2011-03-25 ENCOUNTER — Encounter: Payer: Self-pay | Admitting: Cardiology

## 2011-03-25 ENCOUNTER — Encounter: Payer: Self-pay | Admitting: *Deleted

## 2011-03-25 ENCOUNTER — Ambulatory Visit (INDEPENDENT_AMBULATORY_CARE_PROVIDER_SITE_OTHER): Payer: Medicare Other | Admitting: Cardiology

## 2011-03-25 VITALS — BP 145/77 | HR 52 | Ht 68.0 in | Wt 220.0 lb

## 2011-03-25 DIAGNOSIS — R002 Palpitations: Secondary | ICD-10-CM

## 2011-03-25 DIAGNOSIS — R011 Cardiac murmur, unspecified: Secondary | ICD-10-CM

## 2011-03-25 DIAGNOSIS — Z954 Presence of other heart-valve replacement: Secondary | ICD-10-CM

## 2011-03-25 DIAGNOSIS — I421 Obstructive hypertrophic cardiomyopathy: Secondary | ICD-10-CM

## 2011-03-25 DIAGNOSIS — I10 Essential (primary) hypertension: Secondary | ICD-10-CM

## 2011-03-25 DIAGNOSIS — R0602 Shortness of breath: Secondary | ICD-10-CM

## 2011-03-25 DIAGNOSIS — Z951 Presence of aortocoronary bypass graft: Secondary | ICD-10-CM

## 2011-03-25 DIAGNOSIS — Z8719 Personal history of other diseases of the digestive system: Secondary | ICD-10-CM | POA: Insufficient documentation

## 2011-03-25 DIAGNOSIS — I639 Cerebral infarction, unspecified: Secondary | ICD-10-CM | POA: Insufficient documentation

## 2011-03-25 DIAGNOSIS — J449 Chronic obstructive pulmonary disease, unspecified: Secondary | ICD-10-CM

## 2011-03-25 MED ORDER — CLONIDINE HCL 0.1 MG PO TABS: ORAL_TABLET | ORAL | Status: DC

## 2011-03-25 MED ORDER — DICLOFENAC SODIUM 50 MG PO TBEC: 50.0000 mg | DELAYED_RELEASE_TABLET | Freq: Two times a day (BID) | ORAL | Status: AC

## 2011-03-25 MED ORDER — IPRATROPIUM BROMIDE HFA 17 MCG/ACT IN AERS: INHALATION_SPRAY | RESPIRATORY_TRACT | Status: DC

## 2011-03-25 MED ORDER — IPRATROPIUM BROMIDE HFA 17 MCG/ACT IN AERS: INHALATION_SPRAY | RESPIRATORY_TRACT | Status: AC

## 2011-03-25 MED ORDER — CLONIDINE HCL 0.1 MG PO TABS: ORAL_TABLET | ORAL | Status: AC

## 2011-03-25 NOTE — Assessment & Plan Note (Signed)
Poorly controlled. I told the patient that he can take when necessary clonidine half of a tablet of 0.1 mg up to twice a day for blood pressure greater than 180 mmHg

## 2011-03-25 NOTE — Assessment & Plan Note (Signed)
Hypertrophic obstructive cardiomyopathy: Significant improvement of outflow gradient. No definite SAM. The patient is taking his metoprolol and his outflow murmur sounds less significant.

## 2011-03-25 NOTE — Progress Notes (Signed)
HPI The patient had a recent echocardiogram done in March of 2012. His outflow track gradient has greatly improved. The patient's ejection fraction normal at 60-65% with moderate to severe LVH. There is diastolic dysfunction the left atrium is moderately dilated. The patient is a bi-prostatic valve with a mean gradient of 26 mm of mercury with mild to moderate aortic stenosis. He also had laboratory work done a month ago with a BUN of 26 creatinine of 1.24 potassium was within normal limits The patient has a history of lower GI bleed. His EGD was negative. He has a history of hypertension. Echocardiogram in 2011 showed severe asymmetric LVH and hypertrophic obstructive cardiomyopathy. Outflow gradient is 5.4 m/s. Ejection fraction was 65%. By prostatic valve was functioning normal. There was mild to moderate aortic regurgitation. In 2001 he presented with heart failure secondary to diastolic heart failure Of note is that the patient is status post coronary bypass grafting and aortic valve replacement with a bioprosthesis in 1999. The patient has COPD and is oxygen dependent. He has chronic dyspnea on minimal exertion. During her last office visit we increased his metoprolol to 50 Motrins by mouth twice a day. The patient has chronic dyspnea mainly related to his COPD. He had a lot of wheezing this morning unfortunately he does not always use his inhaler. He also suffered a mild stroke since his last visit this was on April 1. MRI confirmed 2 small strokes. Etiology of this is not clear it is possible that they were cardioembolic. The patient's blood pressure has been poorly controlled however. His wife states that at one point he had a blood pressure. Of 190/80. Primary care physician recommended to take extra metoprolol. However the patient's heart rate is already baseline low. Given his underlying heart disease is possible the patient has paroxysmal atrial fibrillation and we will pursue this further.  Whether or not he is a Coumadin candidate will need to be determined.  Allergies  Allergen Reactions  . Iodinated Diagnostic Agents Hives and Itching    Has had contrast with spinal injections without problem  . Hydrocodone-Acetaminophen     REACTION: confusion    No current outpatient prescriptions on file prior to visit.    Past Medical History  Diagnosis Date  . Hypertension   . PVD (peripheral vascular disease)   . DDD (degenerative disc disease)   . Ulcer of lower extremity     h/o right lower extremity ulcer,resolved  . S/P aortic valve replacement with bioprosthetic valve     Mild outflow gradient LVOT these 3.39m/s    Past Surgical History  Procedure Date  . Aortic valve replacement     s/p  . Hernia repair   . Partial colectomy   . Vertebral augmentation   . Cataract extraction   . Cardiac catheterization   . Appendectomy     Family History  Problem Relation Age of Onset  . Diabetes Other   . Coronary artery disease Other   . Cancer Other     History   Social History  . Marital Status: Married    Spouse Name: N/A    Number of Children: N/A  . Years of Education: N/A   Occupational History  . RETIRED    Social History Main Topics  . Smoking status: Former Smoker    Quit date: 12/02/1958  . Smokeless tobacco: Not on file  . Alcohol Use: No  . Drug Use: No  . Sexually Active: Not on file   Other  Topics Concern  . Not on file   Social History Narrative   No regular exercise    PHYSICAL EXAM BP 145/77  Pulse 52  Ht 5\' 8"  (1.727 m)  Wt 220 lb (99.791 kg)  BMI 33.45 kg/m2  SpO2 93%  General: Well-developed, well-nourished in no distress Head: Normocephalic and atraumatic Eyes:PERRLA/EOMI intact, conjunctiva and lids normal Ears: No deformity or lesions Mouth:normal dentition, normal posterior pharynx Neck: Supple, no JVD.  No masses, thyromegaly or abnormal cervical nodes Lungs: Markedly diminished breath sounds bilaterally without  wheezing.  Normal percussion Cardiac: regular rate and rhythm with normal S1 and S2, no S3 or S4.  PMI is normal.  2/6 crescendo decrescendo murmur.  Abdomen: Normal bowel sounds, abdomen is soft and nontender without masses, organomegaly or hernias noted.  No hepatosplenomegaly MSK: Back normal, normal gait muscle strength and tone normal Vascular: Pulse is normal in all 4 extremities Extremities: No peripheral pitting edema Neurologic: Alert and oriented x 3 Skin: Intact without lesions or rashes Lymphatics: No significant adenopathy Psychologic: Normal affect  ECG:not available  ASSESSMENT AND PLAN

## 2011-03-25 NOTE — Patient Instructions (Addendum)
   Cardionet 21 day monitor If the results of your test are normal or stable, you will receive a letter.  If they are abnormal, the nurse will contact you by phone.  Clonidine 0.1mg  - take 1/2 tab as needed if SBP > 180.  May take 2nd 1/2 tab if still > 180 after 30 minutes.  (Max twice a day).  Atrovent inhaler with Aerochamber - 2 puffs four x day Your physician wants you to follow up in: 6 months.  You will receive a reminder letter in the mail one-two months in advance.  If you don't receive a letter, please call our office to schedule the follow up appointment

## 2011-03-25 NOTE — Assessment & Plan Note (Signed)
I gave the patient an inhaler with Atrovent with AeroChamber. He probably should get some breathing treatments at home. Have asked him to discuss with his primary care provider.

## 2011-03-25 NOTE — Assessment & Plan Note (Signed)
Positive MRI with 2 small strokes per wife's history. We will obtain a CardioNet monitor to make sure the patient does not have any underlying atrial fibrillation.

## 2011-04-02 ENCOUNTER — Telehealth: Payer: Self-pay | Admitting: *Deleted

## 2011-04-02 NOTE — Telephone Encounter (Signed)
Cardionet [employer]) calling to notify us that they have spoke with pt's daughter who notified them that pt was currently in the hospital.  Not sure when he will be released.  Just wanted to notify us of delay in getting service to pt.

## 2011-04-16 NOTE — Assessment & Plan Note (Signed)
[facility]                          [facility] OFFICE NOTE   COLLAN, SCHOENFELD                     MRN:          [id number]  DATE:04/07/2008                            DOB:          Dec 29, 1923    REFERRING PHYSICIAN:  Linward Foster   REFERRING PHYSICIAN:  Payton Doughty, M.D.   REASON FOR CONSULTATION:  Clearance for back surgery.   HISTORY OF PRESENT ILLNESS:  The patient is an 77 year old male with a  history of peripheral vascular disease and status post aortic valve and  pericardial heart valve replacement in 1996.  The patient also has a  prior history of stroke which has caused significant disability.  The  patient also had subsequent fall, making walking painful and difficult.  He denies having substernal chest pain.  He has shortness of breath on  minimal exercise.  He has no orthopnea or PND.  He reports lower  extremity edema and a poor healing wound in the right lower extremity.  The patient has been referred for an evaluation prior to back surgery.  The patient is a former patient of Dr. Mayford Knife and Dr. Georganna Skeans in  [place] and was last seen approximately a year ago.   ALLERGIES:  IVP DYE allergy.   PAST MEDICAL HISTORY:  1. History of appendectomy.  2. Hernia surgery.  3. Carotid endarterectomy.  4. Partial colectomy.  5. Vertebral augmentation.  6. A #25 aortic valve and pericardial heart valve replacement in 2006.  7. Cataract surgery.  8. Hypertension.   MEDICATIONS:  1. Plavix 75 mg p.o. daily.  2. Hydrocodone.  3. Alprazolam.  4. Hydralazine 25 mg p.o. daily.  5. Metoprolol 50 mg p.o. daily.  6. Tylenol PM.  7. Folic acid.  8. Super B.  9. Fish oil.  10.Flaxseed oil.  11.Vitamin C.  12.Coenzyme Q10.  13.Vitamin E.  14.Betacarotene.  15.Zinc.   SOCIAL HISTORY:  The patient is retired and does not smoke or drink.   FAMILY HISTORY:  Notable for mother dying at age 71 from old age.  Father died at age  46 from a heart attack. No other cardiovascular  disease is noted otherwise in sisters and brothers.   REVIEW OF SYSTEMS:  Poor healing wound in the right lower extremity with  some drainage.  No fever or chills.  No melena. No dysuria, frequency.  Remainder of Review of Systems is negative.   PHYSICAL EXAMINATION:  VITAL SIGNS:  Blood pressure 150/79, heart rate  59 beats per minute. Weight is 225 pounds.  GENERAL:  Well-nourished white male in no apparent distress.  HEENT:  Pupils reactive to light.  Conjunctiva clear.  NECK:  Supple. Normal carotid upstroke with some bilateral carotid  bruits.  LUNGS:  Clear.  HEART:  Regular rate and rhythm with soft 1-2/6 systolic murmur at left  upper sternal border.  ABDOMEN:  Soft, nontender.  No rebound or guarding.  Good bowel sounds.  EXTREMITIES:  No cyanosis or clubbing.  There is 1+ peripheral pitting  edema.  There is a poorly healing wound on side of the right lower  extremity.  NEUROLOGIC:  The patient is alert and oriented with residual weakness on  the right side.   PROBLEM LIST:  1. Preoperative clearance for back surgery.  2. Plavix therapy.  3. Poorly healing of the right lower extremity.  4. Aortic valve replacement.  5. Peripheral vascular disease.  6. Carotid artery surgery.   PLAN:  1. The patient will need a 2-D echocardiogram for study for LV      function assessment as well as aortic valve prior to surgery.  2. I have told the patient he is at high risk for prosthetic valve      endocarditis with his poorly healing wound in the right lower      extremity, and I have referred him to the wound clinic.  3. Will follow up on the above test results; but at this point in      time, I do not think the patient needs a stress test.  If the      echocardiogram is within normal limits, I anticipate that he can      proceed at moderate risk with back surgery.  4. I would discontinue Plavix 1 week before the surgery.      Learta Codding, MD,FACC  Electronically Signed    GED/MedQ  DD: 04/10/2008  DT: 04/10/2008  Job #: [id number]   cc:   Payton Doughty, M.D.  Linward Foster

## 2011-04-16 NOTE — Progress Notes (Signed)
[facility]                        PERIPHERAL VASCULAR OFFICE NOTE   Shane Mcclain, Shane Mcclain                     MRN:          [id number]  DATE:06/13/2008                            DOB:          04-02-1924    REASON FOR EVALUATION:  Lower extremity peripheral arterial disease with  non-healing wound of the right leg.   HISTORY OF PRESENT ILLNESS:  Shane Mcclain is an 77 year old gentleman  with multiple medical problems, presenting today for evaluation of lower  extremity arterial occlusive disease.  He has a history of aortic valve  replacement in 1996 as well as previous stroke with significant long-  term sequelae.  Shane Mcclain sustained a fall a few months back and has  had increased problems with mobility since that time.  He is planning on  undergoing back surgery and I was recently evaluated by Dr. Andee Lineman for a  preoperative clearance.  At that time, he was noted to have a right  lower extremity ulceration and was referred for noninvasive vascular  study, which was performed at [facility].  Segmental pressures  were performed and they demonstrated non-compressible vessels with an  ABI of 1.17 on the right and 0.66 on the left.  There were a pressure  drops from the low thigh to the calf and again to the left ankle.  Again, this data is in the context of non-compressible vessels.   He complains of diffuse bilateral leg pain as well as edema more  prominent in the right leg.  He does not have typical claudication  symptoms, but his activity is extremely limited.  He has not had foot  ulcerations.  Prior to his recent wound development on the right medial  leg, he has no history of non-healing ulcerations.   PAST MEDICAL HISTORY:  1. Aortic valve replacement in 1996.  2. Carotid endarterectomy.  3. Partial colectomy.  4. Stroke with residual right-sided weakness and limited mobility.  5. Low back pain with upcoming back surgery.  6.  Hernia surgery.  7. Remote history of appendectomy.  8. Essential hypertension.  9. Cataract surgery.   ALLERGIES:  IVP DYE.   MEDICATIONS:  1. Plavix 75 mg daily.  2. Metoprolol 50 mg daily.  3. Tylenol PM at bedtime.  4. Diphenhydramine 1-2 times daily.  5. Zinc.  6. Beta-Carotene.  7. Vitamin E.  8. Coenzyme Q10.  9. Vitamin C.  10.Flaxseed oil.  11.Fish oil.  12.Folic acid.  13.Calcium.  14.Vitamin D.   P.r.n. medications include hydrocodone, alprazolam, and Excedrin.   SOCIAL HISTORY:  The patient is retired.  He lives in [place] and has a  great deal of family support from his daughters.  He does not smoke  cigarettes or drink alcohol.   FAMILY HISTORY:  His mother lived until age 22.  His father died at age  51 from myocardial infarction.  There is no peripheral arterial disease  in the family.   REVIEW OF SYSTEMS:  Pertinent for right lower extremity weakness  following stroke, chronic right lower extremity edema with poor wound  healing and limited mobility, chronic low back pain,  exertional dyspnea,  and mild abdominal discomfort with meals.  All other systems were  reviewed and are negative except as detailed above.   PHYSICAL EXAMINATION:  GENERAL:  This is an elderly male who is  chronically ill-appearing.  He is in a wheelchair and is unable to get  up to the exam table even with assistance.  VITAL SIGNS:  Weight is 213 pounds, blood pressure 124/68 on the right,  110/70 on the left, heart rate 60, and respiratory rate 16.  HEENT:  Normal.  NECK:  Normal carotid upstrokes.  There are bilateral carotid bruits  present.  JVP is normal.  There is no thyromegaly or thyroid nodules.  LUNGS:  Clear bilaterally.  HEART:  Regular rate and rhythm with a 2/6 systolic ejection murmur  along the left sternal border.  There were no diastolic murmurs or  gallops.  There is no right ventricular heave or lift.  ABDOMEN:  Soft and nontender.  No organomegaly.  No  abdominal bruits.  BACK:  There is no CVA tenderness.  EXTREMITIES:  Femoral pulses are 2+.  There are no femoral bruits  present.  Popliteal pulses are 2+.  Pedal pulses are not palpable.  There is trace edema bilaterally, slightly more on the right with  chronic venostasis changes on the right.  There is a healed very  superficial ulceration on the medial aspect of the right lower leg above  the medial malleolus.  There is a well-healed saphenous vein graft  harvest site on the right.  NEUROLOGIC:  Right-sided weakness is noted.  Normal strength on the  left.  Cranial nerves are intact and equal.  SKIN:  Warm and dry.  There are no ischemic ulcerations on the feet.  Again, nearly healed superficial area noted on the medial right lower  leg, which is nontender to palpation.  There is no exudate or drainage.   ASSESSMENT:  This is an 77 year old male with both lower extremity  venous and arterial disease.  Notation of his ABIs as above.  In the  setting of non-compressible vessels, I think his ABI data is not  particularly valid.  While Shane Mcclain' ulceration is nearly healed at  present and I did not visualize this area when it was at its worst, I  suspect it is related to venostasis.  It is nontender and in the  location of a stasis ulceration involving the leg of previous saphenous  vein graft harvest with more edema than the left.  He clearly has lower  extremity PAD with pulses that are not palpable on the feet.  However,  in the setting of his multiple comorbidities and very limited mobility,  I think that invasive evaluation is not warranted at this time.  It is  unlikely that his long-term outcome would be changed with evaluation and  potential revascularization.  If he develops signs of a critical limb  ischemia, this certainly could be considered in the future.  However, at  this time there is no evidence of resting ischemia.  I would be happy to  see Shane Mcclain in the  future if any of these problems develop.   I did not schedule the patient back for routine followup, but again I  would be happy to see him if he develops problems with ulcerations or  evidence of progressive lower extremity arterial disease.  If he  develops recurrent venostasis ulceration, he would benefit from  treatment in the [facility].  Veverly Fells. Excell Seltzer, MD  Electronically Signed    MDC/MedQ  DD: 07/03/2008  DT: 07/03/2008  Job #: [id number]   cc:   Learta Codding, MD,FACC  Linward Foster

## 2011-04-16 NOTE — Assessment & Plan Note (Signed)
[facility]                          [facility] OFFICE NOTE   Shane, Mcclain                     MRN:          [id number]  DATE:06/27/2008                            DOB:          01/26/1924    CARDIOLOGIST:  Learta Codding, MD,FACC   PRIMARY CARE PHYSICIAN:  Dr. Linward Foster   REASON FOR VISIT:  Three-month followup.   HISTORY OF PRESENT ILLNESS:  Shane Mcclain is an 77 year old male patient  with a history of peripheral arterial disease as well as a history of  aortic valve replacement who saw Dr. Andee Lineman initially on Apr 07, 2008,  for preoperative clearance.  He apparently needs some type of back  surgery.  The patient was noted have a poorly healing ulcer on his right  lower extremity.  Arterial Dopplers were performed that were  significantly abnormal with an ABI of 0.66 on the left.  The patient  also underwent an echocardiogram to further assess his aortic valve  prior to surgery.   The patient's echocardiogram returned revealing severe LVH.  His LVOT  was small and there was an outflow tract obstruction that mainly was  accounted for by his increased velocity and gradient measured.  His EF  was 60-65%.  There was impaired relaxation.  His bioprosthetic aortic  valve was mildly stenotic.  His aortic valve area was 1.3 cm sq.  His  LVOT gradient was 43 mmHg.  He had mild-to-moderate dilatation of the  aortic root.   The patient was evaluated by Dr. Excell Seltzer recently.  I do not have any  notes available at this point in time, but the patient's family tells me  that no further workup was planned for his peripheral arterial disease.  Of note, the patient's ulcer on his right lower extremity has healed.   The patient notes that he continues to have significant back pain and  leg pain related to his degenerative disk disease.  He denies chest  pain.  He does have shortness of breath with exertion.  This is fairly  chronic without  change.  He denies any syncope.  He does note some  abdominal discomfort especially with meals.  He also describes some acid  reflux type symptoms.   CURRENT MEDICATIONS:  Diphenhydramine, multivitamin, Plavix 75 mg daily  - currently on hold, hydrocodone b.i.d., alprazolam 0.5 mg b.i.d.,  metoprolol tartrate 50 mg daily, Tylenol p.m. - currently on hold,  calcium, magnesium daily, folic acid daily, super B complex, fish oil  1200 mg daily - currently on hold, flaxseed oil, vitamin C, Coenzyme  Q10, vitamin E, beta-carotene - currently on hold, zinc currently on  hold, and furosemide 20 mg.   PHYSICAL EXAMINATION:  GENERAL:  He is a chronically ill-appearing  elderly male in no acute stress, arriving in a wheelchair.  VITAL SIGNS:  Blood pressure 136/86, pulse 66, weight not available.  HEENT:  Normal.  NECK:  Without JVD at 90 degrees.  CARDIAC:  Normal S1 and S2.  Regular rate and rhythm with 2/6 systolic  ejection murmur heard best along the left sternal border.  LUNGS:  Clear to auscultation bilaterally.  ABDOMEN:  Nontender.  EXTREMITIES:  Without edema.  NEUROLOGIC:  He is alert and oriented x3.  Cranial nerves II through XII  were grossly intact.   Electrocardiogram reveals sinus rhythm, heart rate of 62, left axis  deviation.  Interventricular conduction delay, first degree AV block,  poor R-wave progression.  LVH with repolarization abnormality.  No  significant changes since previous tracing.   IMPRESSION:  1. Hypertrophic obstructive cardiomyopathy with left ventricular      outflow tract gradient of 43 mmHg.  2. Status post aortic valve replacement.      a.     Mild aortic stenosis by recent echocardiogram.  3. Ejection fraction 60-65% by recent echocardiogram.  4. Peripheral arterial disease as outlined above.  5. Degenerative disk disease.      a.     Needs surgery.  6. History of carotid endarterectomy.  7. History of right lower extremity ulcer - resolved.  8.  Hypertension.   PLAN:  Mr. Holdren returns to the office today for followup.  His  surgery is due with Dr. Channing Mutters in 2 days.  The patient has been cleared by  Dr. Andee Lineman for surgery.  Dr. Andee Lineman did feel that the patient should  have SBE prophylaxis prior to his procedure given his history of HOCM  and aortic valve replacement in the past.  The patient could either have  oral amoxicillin or IV ampicillin or ceftriaxone.  I have written an  order for IV ceftriaxone at 1 g to be given 30-60 minutes prior to his  procedure.  We will send that to Dr. Channing Mutters so that he has that information  prior to the patient's surgery.  Our service will certainly be available  in the peri-operative period as necessary.  The patient will follow up  with Dr. Andee Lineman in the next 3 months or sooner p.r.n.  Of note, the  patient was complaining of some dyspepsia like symptoms.  I have  recommended  generic Zantac 150 mg b.i.d.  The patient to follow up further with his  primary care physician for this.      Shane Newcomer, PA-C  Electronically Signed      Jonelle Sidle, MD  Electronically Signed   SW/MedQ  DD: 06/27/2008  DT: 06/28/2008  Job #: [id number]   cc:   Marin Roberts, M.D.

## 2011-04-16 NOTE — Assessment & Plan Note (Signed)
[facility]                          [facility] OFFICE NOTE   Shane Mcclain, Shane Mcclain                     MRN:          [id number]  DATE:06/28/2009                            DOB:          04/23/1924    REFERRING PHYSICIAN:  Doreen Beam, MD   HISTORY OF PRESENT ILLNESS:  The patient is a 77 year old male with  history of severe peripheral arterial disease and an initially  nonhealing ulcer.  He also has aortic valve replacement and single-  vessel coronary bypass grafting.  The patient in the interim has had a  resolution of his ulcer on the leg.  Dr. Excell Seltzer has seen the patient and  recommended conservative treatment approach.  The ulcer was felt to be  secondary to venous stasis more so than arterial inflow disease.  The  patient currently denies any chest pain, shortness of breath, orthopnea,  PND, palpitations, or syncope.  He does reports lower extremity edema  after starting Flomax for prostatism.  He does have to 2-3+ peripheral  or pitting edema.  His blood pressure has remained stable.  The  patient's last echocardiogram was approximately a year ago.   MEDICATIONS:  1. Metoprolol tartrate 50 mg p.o. daily.  2. Zinc and copper 2 mg a day.  3. Hydroxyzine 25 mg nightly.  4. Citalopram 10 mg p.o. daily.  5. Amitriptyline 10 mg p.o. daily.  6. Flomax 0.4 mg p.o. daily.  7. Calcium, magnesium, and zinc daily.  8. Folic acid.  9. Super B complex.  10.Fish oil.  11.Flaxseed oil.  12.Vitamin C.  13.Coenzyme Q10.  14.Vitamin E daily.  15.Benadryl 25 mg p.r.n.  16.Zyrtec 10 mg p.o. p.r.n.   PHYSICAL EXAMINATION:  VITAL SIGNS:  Blood pressure is 139/77, heart  rate is 64, saturation is 95%, and weight 213 pounds.  GENERAL:  Well-nourished white male in no apparent distress.  HEENT:  Pupils, eyes are equal; conjunctivae clear.  NECK:  Supple.  No carotid upstroke.  No carotid bruits.  LUNGS:  Clear breath sounds bilaterally.  HEART:   Regular rate and rhythm with 2/6 crescendo-decrescendo murmur at  the left upper sternal border with some radiation to the left lower  sternal border with a murmur slightly louder at 3/6.  ABDOMEN:  Soft, nontender.  No rebound or guarding.  Good bowel sounds.  EXTREMITIES:  No cyanosis, clubbing, or edema.  NEUROLOGIC:  The patient is alert and oriented.  Grossly nonfocal.   PROBLEM LIST:  1. Status post aortic valve replacement.      a.     Mild aortic stenosis by echo in 2009.      b.     Outflow gradient of 43 mmHg.  2. Ejection fraction 60-65%.  3. Peripheral arterial disease followed by Dr. Excell Seltzer, medical      therapy.  4. Degenerative disk disease.  5. History of carotid endarterectomy.  6. History of right lower extremity ulcer, resolved.  7. Hypertension, stable.   PLAN:  1. At this point in time, I made no change in the patient's medical      regimen.  We  reviewed his EKG, which is stable which shows sinus      rhythm with left ventricular hypertrophy and repolarization      changes.  2. The patient from cardiac standpoint, has no chest pain, does not      need any ischemia test.  3. The patient does have a somewhat louder murmur on exam and is due      for a followup echocardiogram, which will be ordered.     Learta Codding, MD,FACC  Electronically Signed    GED/MedQ  DD: 06/28/2009  DT: 06/29/2009  Job #: [id number]   cc:   Doreen Beam, MD

## 2011-04-22 ENCOUNTER — Telehealth: Payer: Self-pay | Admitting: *Deleted

## 2011-04-22 NOTE — Telephone Encounter (Signed)
Received call from [employer] - has spoken with pt's daughter.  He is currently in nursing facility & not sure when he will be out.  Will put a hold on monitor for now.

## 2011-06-04 ENCOUNTER — Other Ambulatory Visit: Payer: Self-pay | Admitting: Neurosurgery

## 2011-06-04 DIAGNOSIS — M47816 Spondylosis without myelopathy or radiculopathy, lumbar region: Secondary | ICD-10-CM

## 2011-06-06 ENCOUNTER — Ambulatory Visit
Admission: RE | Admit: 2011-06-06 | Discharge: 2011-06-06 | Disposition: A | Discharge status: Home / Self Care | Payer: Medicare Other | Source: Ambulatory Visit | Attending: Neurosurgery | Admitting: Neurosurgery

## 2011-06-06 DIAGNOSIS — M47816 Spondylosis without myelopathy or radiculopathy, lumbar region: Secondary | ICD-10-CM

## 2011-10-17 ENCOUNTER — Ambulatory Visit: Payer: Medicare Other | Admitting: Cardiology

## 2011-11-27 ENCOUNTER — Other Ambulatory Visit: Payer: Self-pay

## 2011-11-27 DIAGNOSIS — I739 Peripheral vascular disease, unspecified: Secondary | ICD-10-CM

## 2011-12-20 ENCOUNTER — Ambulatory Visit: Payer: Medicare Other | Admitting: Cardiology

## 2011-12-23 ENCOUNTER — Encounter: Payer: Medicare Other | Admitting: Surgery

## 2011-12-23 ENCOUNTER — Other Ambulatory Visit: Payer: Medicare Other

## 2012-01-03 ENCOUNTER — Other Ambulatory Visit: Payer: Medicare Other

## 2012-01-03 ENCOUNTER — Encounter: Payer: Medicare Other | Admitting: Vascular Surgery

## 2012-03-18 ENCOUNTER — Other Ambulatory Visit: Payer: Self-pay | Admitting: Neurosurgery

## 2012-03-18 DIAGNOSIS — M47812 Spondylosis without myelopathy or radiculopathy, cervical region: Secondary | ICD-10-CM

## 2012-03-23 ENCOUNTER — Ambulatory Visit
Admission: RE | Admit: 2012-03-23 | Discharge: 2012-03-23 | Disposition: A | Discharge status: Home / Self Care | Payer: Medicare Other | Source: Ambulatory Visit | Attending: Neurosurgery | Admitting: Neurosurgery

## 2012-03-23 VITALS — BP 111/68 | HR 62

## 2012-03-23 DIAGNOSIS — M47812 Spondylosis without myelopathy or radiculopathy, cervical region: Secondary | ICD-10-CM

## 2012-03-23 MED ORDER — IOHEXOL 300 MG/ML  SOLN
1.0000 mL | Freq: Once | INTRAMUSCULAR | Status: AC | PRN
  Administered 2012-03-23: 1 mL via EPIDURAL

## 2012-03-23 MED ORDER — TRIAMCINOLONE ACETONIDE 40 MG/ML IJ SUSP (RADIOLOGY)
60.0000 mg | Freq: Once | INTRAMUSCULAR | Status: AC
  Administered 2012-03-23: 60 mg via EPIDURAL

## 2012-03-23 NOTE — Progress Notes (Addendum)
Pt has allergy to contrast  but no trouble in past with  Injections where contrast was used.  Unable to obtain IV site, Dr. Karin Golden aware no IV in place.dd

## 2012-03-23 NOTE — Discharge Instructions (Signed)
Post Procedure Spinal Discharge Instruction Sheet ° °1. You may resume a regular diet and any medications that you routinely take (including pain medications). ° °2. No driving day of procedure. ° °3. Light activity throughout the rest of the day.  Do not do any strenuous work, exercise, bending or lifting.  The day following the procedure, you can resume normal physical activity but you should refrain from exercising or physical therapy for at least three days thereafter. ° ° °Common Side Effects: ° °· Headaches- take your usual medications as directed by your physician.  Increase your fluid intake.  Caffeinated beverages may be helpful.  Lie flat in bed until your headache resolves. ° °· Restlessness or inability to sleep- you may have trouble sleeping for the next few days.  Ask your referring physician if you need any medication for sleep. ° °· Facial flushing or redness- should subside within a few days. ° °· Increased pain- a temporary increase in pain a day or two following your procedure is not unusual.  Take your pain medication as prescribed by your referring physician. ° °· Leg cramps ° °Please contact our office at [phone] for the following symptoms: °· Fever greater than 100 degrees. °· Headaches unresolved with medication after 2-3 days. °· Increased swelling, pain, or redness at injection site. ° °Thank you for visiting our office. °

## 2013-08-02 DEATH — deceased

## 2014-03-18 ENCOUNTER — Telehealth: Payer: Self-pay

## 2014-03-18 NOTE — Telephone Encounter (Signed)
Patient past away per [facility]  °
